# Patient Record
Sex: Male | Born: 1995 | Race: Black or African American | Hispanic: No | Marital: Single | Smoking: Never smoker
Health system: Southern US, Community
[De-identification: ages and names within clinical notes are randomized; demographics above are authoritative.]

## PROBLEM LIST (undated history)

## (undated) DIAGNOSIS — F909 Attention-deficit hyperactivity disorder, unspecified type: Secondary | ICD-10-CM

## (undated) HISTORY — PX: MOUTH SURGERY: SHX715

---
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
## 2020-04-24 ENCOUNTER — Ambulatory Visit
Admission: EM | Admit: 2020-04-24 | Discharge: 2020-04-24 | Disposition: A | Discharge status: Home / Self Care | Payer: Self-pay

## 2020-04-24 DIAGNOSIS — R102 Pelvic and perineal pain: Secondary | ICD-10-CM

## 2020-04-24 DIAGNOSIS — R1032 Left lower quadrant pain: Secondary | ICD-10-CM

## 2020-04-24 NOTE — ED Triage Notes (Signed)
Pt states lifting weights yesterday and felt a pop to LLQ. States has been having lower abdominal discomfort for over a month with a neg STD check.

## 2020-04-24 NOTE — ED Provider Notes (Signed)
[facility]    CSN: [id number] Arrival date & time: 04/24/20  1056      History   Chief Complaint Chief Complaint  Patient presents with  . Abdominal Pain    HPI JAXON FLATT is a 24 y.o. male.   24 year old male comes in for abdominal pain. Has had 1 month history of intermittent suprapubic pain, and 2 day history of LLQ pain  Describes suprapubic pain as discomfort, which is intermittent, lasting 1-2 hrs at times. This does not occur daily and has not noticed any pattern to the pain. No obvious aggravating or alleviating factor. Denies nausea/vomiting. Denies diarrhea, constipation. Has noticed intermittent dysuria at times with negative STD testing. Denies current urinary symptoms.  LLQ pain started after heavy lifting yesterday, and noticed a popping sensation. Since then, has had some discomfort to the area, though no hindrance to activity. No other associated symptoms. No bulging sensation.      Past Medical History:  Diagnosis Date  . ADHD (attention deficit hyperactivity disorder)   . ADHD (attention deficit hyperactivity disorder)     There are no problems to display for this patient.   Past Surgical History:  Procedure Laterality Date  . MOUTH SURGERY         Home Medications    Prior to Admission medications   Not on File    Family History History reviewed. No pertinent family history.  Social History Social History   Tobacco Use  . Smoking status: Never Smoker  . Smokeless tobacco: Never Used  Substance Use Topics  . Alcohol use: No  . Drug use: Not Currently     Allergies   Patient has no known allergies.   Review of Systems Review of Systems  Reason unable to perform ROS: See HPI as above.     Physical Exam Triage Vital Signs ED Triage Vitals  Enc Vitals Group     BP      Pulse      Resp      Temp      Temp src      SpO2      Weight      Height      Head Circumference      Peak Flow      Pain  Score      Pain Loc      Pain Edu?      Excl. in GC?    No data found.  Updated Vital Signs BP 128/83 (BP Location: Left Arm)   Pulse (!) 58   Temp 97.9 F (36.6 C) (Oral)   Resp 18   SpO2 97%   Physical Exam Exam conducted with a chaperone present.  Constitutional:      General: He is not in acute distress.    Appearance: Normal appearance. He is not ill-appearing, toxic-appearing or diaphoretic.  HENT:     Head: Normocephalic and atraumatic.  Cardiovascular:     Rate and Rhythm: Normal rate and regular rhythm.  Pulmonary:     Effort: Pulmonary effort is normal. No respiratory distress.     Comments: LCTAB Abdominal:     General: Bowel sounds are normal.     Palpations: Abdomen is soft.     Tenderness: There is no abdominal tenderness. There is no right CVA tenderness, left CVA tenderness, guarding or rebound.     Hernia: No hernia is present. There is no hernia in the left inguinal area or right inguinal area.  Genitourinary:    Penis: Normal and circumcised.      Testes: Normal. Cremasteric reflex is present.     Epididymis:     Right: Normal.     Left: Normal.  Musculoskeletal:     Cervical back: Normal range of motion and neck supple.  Skin:    General: Skin is warm and dry.  Neurological:     Mental Status: He is alert and oriented to person, place, and time.      UC Treatments / Results  Labs (all labs ordered are listed, but only abnormal results are displayed) Labs Reviewed - No data to display  EKG   Radiology No results found.  Procedures Procedures (including critical care time)  Medications Ordered in UC Medications - No data to display  Initial Impression / Assessment and Plan / UC Course  I have reviewed the triage vital signs and the nursing notes.  Pertinent labs & imaging results that were available during my care of the patient were reviewed by me and considered in my medical decision making (see chart for details).    No  alarming signs on exam. Abdomen soft, +BS, nontender to palpation. No obvious signs of hernia. Symptomatic treatment discussed. Return precautions given.  Final Clinical Impressions(s) / UC Diagnoses   Final diagnoses:  LLQ pain  Suprapubic pain   ED Prescriptions    None     PDMP not reviewed this encounter.   Belinda Martorano, PA-C 04/24/20 1140

## 2020-04-24 NOTE — Discharge Instructions (Signed)
No alarming signs on exam. No signs of hernia. Rest, decrease weight during lifting for a few weeks. Ibuprofen as needed for pain. Follow up with PCP if symptoms not improving.

## 2020-05-01 ENCOUNTER — Emergency Department (HOSPITAL_COMMUNITY)
Admission: EM | Admit: 2020-05-01 | Discharge: 2020-05-01 | Disposition: A | Discharge status: Home / Self Care | Payer: Self-pay | Attending: Emergency Medicine | Admitting: Emergency Medicine

## 2020-05-01 ENCOUNTER — Encounter (HOSPITAL_COMMUNITY): Payer: Self-pay

## 2020-05-01 ENCOUNTER — Other Ambulatory Visit: Payer: Self-pay

## 2020-05-01 DIAGNOSIS — R109 Unspecified abdominal pain: Secondary | ICD-10-CM | POA: Insufficient documentation

## 2020-05-01 LAB — URINALYSIS, ROUTINE W REFLEX MICROSCOPIC
Bacteria, UA: NONE SEEN
Bilirubin Urine: NEGATIVE
Glucose, UA: NEGATIVE mg/dL
Hgb urine dipstick: NEGATIVE
Ketones, ur: NEGATIVE mg/dL
Leukocytes,Ua: NEGATIVE
Nitrite: NEGATIVE
Protein, ur: 30 mg/dL — AB
Specific Gravity, Urine: 1.029 (ref 1.005–1.030)
pH: 6 (ref 5.0–8.0)

## 2020-05-01 LAB — COMPREHENSIVE METABOLIC PANEL
ALT: 37 U/L (ref 0–44)
AST: 41 U/L (ref 15–41)
Albumin: 4.6 g/dL (ref 3.5–5.0)
Alkaline Phosphatase: 74 U/L (ref 38–126)
Anion gap: 14 (ref 5–15)
BUN: 12 mg/dL (ref 6–20)
CO2: 25 mmol/L (ref 22–32)
Calcium: 9.9 mg/dL (ref 8.9–10.3)
Chloride: 102 mmol/L (ref 98–111)
Creatinine, Ser: 0.92 mg/dL (ref 0.61–1.24)
GFR, Estimated: >60 mL/min (ref >60)
Glucose, Bld: 106 mg/dL — ABNORMAL HIGH (ref 70–99)
Potassium: 3.7 mmol/L (ref 3.5–5.1)
Sodium: 141 mmol/L (ref 135–145)
Total Bilirubin: 1.1 mg/dL (ref 0.3–1.2)
Total Protein: 8.1 g/dL (ref 6.5–8.1)

## 2020-05-01 LAB — CBC
HCT: 46.1 % (ref 39.0–52.0)
Hemoglobin: 15.1 g/dL (ref 13.0–17.0)
MCH: 27.7 pg (ref 26.0–34.0)
MCHC: 32.8 g/dL (ref 30.0–36.0)
MCV: 84.6 fL (ref 80.0–100.0)
Platelets: 288 10*3/uL (ref 150–400)
RBC: 5.45 MIL/uL (ref 4.22–5.81)
RDW: 13.1 % (ref 11.5–15.5)
WBC: 7.4 10*3/uL (ref 4.0–10.5)
nRBC: 0 % (ref 0.0–0.2)

## 2020-05-01 LAB — LIPASE, BLOOD: Lipase: 62 U/L — ABNORMAL HIGH (ref 11–51)

## 2020-05-01 NOTE — ED Notes (Signed)
Discharged with no concerns  

## 2020-05-01 NOTE — ED Provider Notes (Signed)
[facility]-EMERGENCY DEPT Provider Note   CSN: [id number] Arrival date & time: 05/01/20  1648     History Chief Complaint  Patient presents with  . Abdominal Pain    Kevin Dorsey is a 24 y.o. male.  Presents here with concern for abdominal pain.  Patient reports that he had pain starting couple weeks ago, intermittent, seems to come and go at random, had an episode after lifting weights, thinks he may have pulled a muscle.  Episode today occurring from around 11:00 to 2:00 today.  Currently no pain.  Pain was aching, cramping sensation, left side.  No blood in urine, no dysuria or hematuria.  Reports that he is sexually active, with women.  Reports he was recently checked for STDs and was negative.  Few days ago he all also noted small streak of blood in his stool.  Has been having no pain around anus, no discharge from anus, concern he may have hemorrhoids.  No dark or tarry stools, no blood in the stools since.  HPI     Past Medical History:  Diagnosis Date  . ADHD (attention deficit hyperactivity disorder)   . ADHD (attention deficit hyperactivity disorder)     There are no problems to display for this patient.   Past Surgical History:  Procedure Laterality Date  . MOUTH SURGERY         History reviewed. No pertinent family history.  Social History   Tobacco Use  . Smoking status: Never Smoker  . Smokeless tobacco: Never Used  Substance Use Topics  . Alcohol use: No  . Drug use: Not Currently    Home Medications Prior to Admission medications   Not on File    Allergies    Patient has no known allergies.  Review of Systems   Review of Systems  Constitutional: Negative for chills and fever.  HENT: Negative for ear pain and sore throat.   Eyes: Negative for pain and visual disturbance.  Respiratory: Negative for cough and shortness of breath.   Cardiovascular: Negative for chest pain and palpitations.  Gastrointestinal: Positive  for abdominal pain. Negative for vomiting.  Genitourinary: Negative for dysuria and hematuria.  Musculoskeletal: Negative for arthralgias and back pain.  Skin: Negative for color change and rash.  Neurological: Negative for seizures and syncope.  All other systems reviewed and are negative.   Physical Exam Updated Vital Signs BP 134/81   Pulse (!) 59   Temp 98.5 F (36.9 C) (Oral)   Resp 16   SpO2 100%   Physical Exam Vitals and nursing note reviewed. Exam conducted with a chaperone present Nurse, learning disability).  Constitutional:      Appearance: He is well-developed.  HENT:     Head: Normocephalic and atraumatic.  Eyes:     Conjunctiva/sclera: Conjunctivae normal.  Cardiovascular:     Rate and Rhythm: Normal rate and regular rhythm.     Heart sounds: No murmur heard.   Pulmonary:     Effort: Pulmonary effort is normal. No respiratory distress.     Breath sounds: Normal breath sounds.  Abdominal:     Palpations: Abdomen is soft.     Tenderness: There is no abdominal tenderness.  Genitourinary:    Rectum: Normal.     Comments: Normal rectum, no hemorrhoids, no fissures, brown stool, no gross blood Musculoskeletal:     Cervical back: Neck supple.  Skin:    General: Skin is warm and dry.  Neurological:     General:  No focal deficit present.     Mental Status: He is alert.     ED Results / Procedures / Treatments   Labs (all labs ordered are listed, but only abnormal results are displayed) Labs Reviewed  LIPASE, BLOOD - Abnormal; Notable for the following components:      Result Value   Lipase 62 (*)    All other components within normal limits  COMPREHENSIVE METABOLIC PANEL - Abnormal; Notable for the following components:   Glucose, Bld 106 (*)    All other components within normal limits  URINALYSIS, ROUTINE W REFLEX MICROSCOPIC - Abnormal; Notable for the following components:   Protein, ur 30 (*)    All other components within normal limits  CBC     EKG None  Radiology No results found.  Procedures Procedures (including critical care time)  Medications Ordered in ED Medications - No data to display  ED Course  I have reviewed the triage vital signs and the nursing notes.  Pertinent labs & imaging results that were available during my care of the patient were reviewed by me and considered in my medical decision making (see chart for details).    MDM Rules/Calculators/A&P                         24 year old male presenting to ER with intermittent episodes of left flank pain.  On exam patient is remarkably well-appearing in no distress.  His labs are within normal limits, UA negative for infection.  Suspect MSK in etiology.  Recommend supportive care for now, follow-up with primary care doctor.  After the discussed management above, the patient was determined to be safe for discharge.  The patient was in agreement with this plan and all questions regarding their care were answered.  ED return precautions were discussed and the patient will return to the ED with any significant worsening of condition.   Final Clinical Impression(s) / ED Diagnoses Final diagnoses:  Left sided abdominal pain    Rx / DC Orders ED Discharge Orders    None       Milagros Loll, MD 05/01/20 1929

## 2020-05-01 NOTE — Discharge Instructions (Signed)
Follow-up with your primary doctor regarding symptoms from today.  Take Tylenol Motrin as needed for pain control.

## 2020-05-01 NOTE — ED Triage Notes (Signed)
Pt arrived via walk in, c/o lower abd pain. Denies any n/v or diarrhea. Was seen at urgent care for same, states he was cleared but believes something is wrong.

## 2021-09-11 ENCOUNTER — Other Ambulatory Visit: Payer: Self-pay

## 2021-09-11 ENCOUNTER — Emergency Department (HOSPITAL_COMMUNITY)
Admission: EM | Admit: 2021-09-11 | Discharge: 2021-09-11 | Disposition: A | Discharge status: Home / Self Care | Payer: Medicaid Other | Attending: Emergency Medicine | Admitting: Emergency Medicine

## 2021-09-11 DIAGNOSIS — Y92812 Truck as the place of occurrence of the external cause: Secondary | ICD-10-CM | POA: Insufficient documentation

## 2021-09-11 DIAGNOSIS — S0993XA Unspecified injury of face, initial encounter: Secondary | ICD-10-CM

## 2021-09-11 DIAGNOSIS — S01512A Laceration without foreign body of oral cavity, initial encounter: Secondary | ICD-10-CM | POA: Insufficient documentation

## 2021-09-11 DIAGNOSIS — Z23 Encounter for immunization: Secondary | ICD-10-CM | POA: Insufficient documentation

## 2021-09-11 DIAGNOSIS — W228XXA Striking against or struck by other objects, initial encounter: Secondary | ICD-10-CM | POA: Insufficient documentation

## 2021-09-11 DIAGNOSIS — Y990 Civilian activity done for income or pay: Secondary | ICD-10-CM | POA: Insufficient documentation

## 2021-09-11 MED ORDER — TETANUS-DIPHTH-ACELL PERTUSSIS 5-2.5-18.5 LF-MCG/0.5 IM SUSY
0.5000 mL | PREFILLED_SYRINGE | Freq: Once | INTRAMUSCULAR | Status: AC
  Administered 2021-09-11: 21:00:00 0.5 mL via INTRAMUSCULAR
  Filled 2021-09-11: qty 0.5

## 2021-09-11 NOTE — ED Provider Notes (Signed)
?[facility]-EMERGENCY DEPT ?Provider Note ? ? ?CSN: [id number] ?Arrival date & time: 09/11/21  2037 ? ?  ? ?History ? ?Chief Complaint  ?Patient presents with  ? Mouth Injury  ?  Gum injury patient work mouth at work.   ? ? ?Kevin Dorsey is a 26 y.o. male with here for evaluation of mouth injury.  Was at work when a metal object hit his right lip.  Noted bleeding to the inside of his mouth.  He denies any loose dentition, drooling, dysphagia, trismus, pain with mouth opening.  Unknown last tetanus.  No change in voice.  Denies any pain. ? ?HPI ? ?  ? ?Home Medications ?Prior to Admission medications   ?Not on File  ?   ? ?Allergies    ?Patient has no known allergies.   ? ?Review of Systems   ?Review of Systems  ?Constitutional: Negative.   ?HENT:  Positive for mouth sores.   ?Eyes: Negative.   ?Respiratory: Negative.    ?Cardiovascular: Negative.   ?Gastrointestinal: Negative.   ?Genitourinary: Negative.   ?Musculoskeletal: Negative.   ?Skin: Negative.   ?Neurological: Negative.   ?All other systems reviewed and are negative. ? ?Physical Exam ?Updated Vital Signs ?BP (!) 148/76 (BP Location: Right Arm)   Pulse 65   Temp 98.2 ?F (36.8 ?C) (Oral)   Resp 16   Ht 5\' 9"  (1.753 m)   Wt 89.8 kg   SpO2 95%   BMI 29.24 kg/m?  ?Physical Exam ?Vitals and nursing note reviewed.  ?Constitutional:   ?   General: He is not in acute distress. ?   Appearance: He is well-developed. He is not ill-appearing, toxic-appearing or diaphoretic.  ?HENT:  ?   Head: Normocephalic and atraumatic.  ?   Jaw: There is normal jaw occlusion.  ?   Comments: No drooling, dysphagia or trismus.  No malalignment to jaw.  Nontender mandible, maxilla.  Nontender facial bones ?   Mouth/Throat:  ?   Lips: Pink.  ?   Mouth: Mucous membranes are moist. Lacerations present.  ?   Pharynx: Oropharynx is clear. Uvula midline.  ?   Tonsils: No tonsillar exudate or tonsillar abscesses.  ? ?   Comments: No loose dentition ?1 cm gaping  laceration right inner lip with exposed pulp ?Tongue midline, no gingival traumatic injuries. ?Eyes:  ?   Pupils: Pupils are equal, round, and reactive to light.  ?Cardiovascular:  ?   Rate and Rhythm: Normal rate and regular rhythm.  ?Pulmonary:  ?   Effort: Pulmonary effort is normal. No respiratory distress.  ?Abdominal:  ?   General: There is no distension.  ?   Palpations: Abdomen is soft.  ?Musculoskeletal:     ?   General: Normal range of motion.  ?   Cervical back: Normal range of motion and neck supple.  ?Skin: ?   General: Skin is warm and dry.  ?Neurological:  ?   General: No focal deficit present.  ?   Mental Status: He is alert and oriented to person, place, and time.  ? ? ?ED Results / Procedures / Treatments   ?Labs ?(all labs ordered are listed, but only abnormal results are displayed) ?Labs Reviewed - No data to display ? ?EKG ?None ? ?Radiology ?No results found. ? ?Procedures ? .Laceration Repair ? ?Date/Time: 09/11/2021 9:09 PM ?Performed by: 11/11/2021 A, PA-C ?Authorized by: Ralph Leyden, PA-C  ? ?Consent:  ?  Consent obtained:  Verbal ?  Consent  given by:  Patient ?  Risks discussed:  Infection, need for additional repair, pain, poor cosmetic result and poor wound healing ?  Alternatives discussed:  No treatment and delayed treatment ?Universal protocol:  ?  Procedure explained and questions answered to patient or proxy's satisfaction: yes   ?  Relevant documents present and verified: yes   ?  Test results available: yes   ?  Imaging studies available: yes   ?  Required blood products, implants, devices, and special equipment available: yes   ?  Site/side marked: yes   ?  Immediately prior to procedure, a time out was called: yes   ?  Patient identity confirmed:  Verbally with patient ?Anesthesia:  ?  Anesthesia method:  None ?Laceration details:  ?  Location:  Mouth ?  Mouth location:  R buccal mucosa ?  Length (cm):  1 ?  Depth (mm):  4 ?Pre-procedure details:  ?  Preparation:   Patient was prepped and draped in usual sterile fashion and imaging obtained to evaluate for foreign bodies ?Exploration:  ?  Hemostasis achieved with:  Direct pressure ?  Wound extent: no foreign bodies/material noted, no muscle damage noted, no tendon damage noted, no underlying fracture noted and no vascular damage noted   ?Treatment:  ?  Area cleansed with:  Saline ?  Amount of cleaning:  Extensive ?  Irrigation solution:  Sterile saline ?Skin repair:  ?  Repair method:  Sutures ?  Suture size:  5-0 ?  Suture material:  Chromic gut ?  Suture technique:  Simple interrupted ?  Number of sutures:  1 ?Approximation:  ?  Approximation:  Loose ?Repair type:  ?  Repair type:  Intermediate ?Post-procedure details:  ?  Dressing:  Open (no dressing) ?  Procedure completion:  Tolerated well, no immediate complications  ? ? ?Medications Ordered in ED ?Medications  ?Tdap (BOOSTRIX) injection 0.5 mL (0.5 mLs Intramuscular Given 09/11/21 2059)  ? ?ED Course/ Medical Decision Making/ A&P ?  ? ?26 year old here for evaluation of lip injury at work.  Metal object popped up subsequently hitting patient in her right lip.  Is nontender facial bones.  No drooling, dysphagia, trismus.  No jaw malalignment.  Normal phonation.  No loose dentition.  Tongue midline with evidence of tongue injury.  He is 1 cm gaping laceration right inner lip.  Update tetanus.  Do not feel patient needs imaging at this time. ? ?Did have to place dissolvable suture due to gaping area.  Discussed with patient swish and spit after p.o. intake, soft diet.  [facility].  He is agreeable. ? ?The patient has been appropriately medically screened and/or stabilized in the ED. I have low suspicion for any other emergent medical condition which would require further screening, evaluation or treatment in the ED or require inpatient management. ? ?Patient is hemodynamically stable and in no acute distress.  Patient able to ambulate in department prior to ED.   Evaluation does not show acute pathology that would require ongoing or additional emergent interventions while in the emergency department or further inpatient treatment.  I have discussed the diagnosis with the patient and answered all questions.  Pain is been managed while in the emergency department and patient has no further complaints prior to discharge.  Patient is comfortable with plan discussed in room and is stable for discharge at this time.  I have discussed strict return precautions for returning to the emergency department.  Patient was encouraged to follow-up with PCP/specialist  refer to at discharge.  ? ?                        ?Medical Decision Making ?Risk ?OTC drugs. ?Prescription drug management. ? ? ? ? ? ? ? ? ? ?Final Clinical Impression(s) / ED Diagnoses ?Final diagnoses:  ?Injury of mouth, initial encounter  ? ? ?Rx / DC Orders ?ED Discharge Orders   ? ? None  ? ?  ? ? ?  ?Anthonyjames Bargar A, PA-C ?09/11/21 2114 ? ?  ?Mancel Bale, MD ?09/11/21 2311 ? ?

## 2021-09-11 NOTE — Discharge Instructions (Addendum)
Swish and spit with water anytime you eat or drink anything that is not water.  Recommend soft diet over the next few days.  Tylenol Motrin as needed for pain.  If area begins to bleed hold pressure. ? ?Follow-up outpatient with PCP ? ?Return for new or worsening symptoms ?
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
## 2021-10-27 ENCOUNTER — Other Ambulatory Visit: Payer: Self-pay

## 2021-10-27 ENCOUNTER — Encounter (HOSPITAL_COMMUNITY): Payer: Self-pay

## 2021-10-27 ENCOUNTER — Emergency Department (HOSPITAL_COMMUNITY)
Admission: EM | Admit: 2021-10-27 | Discharge: 2021-10-27 | Disposition: A | Discharge status: Home / Self Care | Payer: No Typology Code available for payment source | Attending: Emergency Medicine | Admitting: Emergency Medicine

## 2021-10-27 ENCOUNTER — Emergency Department (HOSPITAL_COMMUNITY): Payer: No Typology Code available for payment source

## 2021-10-27 DIAGNOSIS — Y92410 Unspecified street and highway as the place of occurrence of the external cause: Secondary | ICD-10-CM | POA: Insufficient documentation

## 2021-10-27 DIAGNOSIS — M25522 Pain in left elbow: Secondary | ICD-10-CM | POA: Diagnosis present

## 2021-10-27 MED ORDER — CYCLOBENZAPRINE HCL 10 MG PO TABS: 10.0000 mg | ORAL_TABLET | Freq: Two times a day (BID) | ORAL | 0 refills | Status: AC | PRN
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
## 2021-10-27 NOTE — Discharge Instructions (Addendum)
Please return to ED with any new symptoms ?Please continue taking ibuprofen, icing your elbow, using heating pads and utilizing muscle relaxers. ?Please be aware that muscle relaxers will make you unsteady on your feet.  Please take it sparingly and only if you are not going to be driving ? ?
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
## 2021-10-27 NOTE — ED Notes (Signed)
I provided reinforced discharge education based off of discharge instructions. Pt acknowledged and understood my education. Pt had no further questions/concerns for provider/myself.  °

## 2022-11-11 ENCOUNTER — Telehealth: Payer: Self-pay

## 2022-11-11 NOTE — Telephone Encounter (Signed)
LVM for patient to call back. AS< CMA 
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
